# Patient Record
Sex: Male | Born: 1990
Health system: Southern US, Community
[De-identification: ages and names within clinical notes are randomized; demographics above are authoritative.]

## PROBLEM LIST (undated history)

## (undated) HISTORY — PX: NASAL SINUS SURGERY: SHX719

---

## 2010-11-12 ENCOUNTER — Emergency Department (HOSPITAL_COMMUNITY): Payer: Self-pay

## 2010-11-12 ENCOUNTER — Observation Stay (HOSPITAL_COMMUNITY)
Admission: EM | Admit: 2010-11-12 | Discharge: 2010-11-12 | Disposition: A | Discharge status: Home / Self Care | Payer: No Typology Code available for payment source | Attending: Oral and Maxillofacial Surgery | Admitting: Oral and Maxillofacial Surgery

## 2010-11-12 DIAGNOSIS — S0180XA Unspecified open wound of other part of head, initial encounter: Principal | ICD-10-CM | POA: Insufficient documentation

## 2010-11-12 DIAGNOSIS — S02109B Fracture of base of skull, unspecified side, initial encounter for open fracture: Secondary | ICD-10-CM | POA: Insufficient documentation

## 2010-11-12 DIAGNOSIS — Y998 Other external cause status: Secondary | ICD-10-CM | POA: Insufficient documentation

## 2010-11-12 DIAGNOSIS — Z01812 Encounter for preprocedural laboratory examination: Secondary | ICD-10-CM | POA: Insufficient documentation

## 2010-11-12 DIAGNOSIS — Z01818 Encounter for other preprocedural examination: Secondary | ICD-10-CM | POA: Insufficient documentation

## 2010-11-12 LAB — CBC
HCT: 38.1 % — ABNORMAL LOW (ref 39.0–52.0)
Hemoglobin: 14.1 g/dL (ref 13.0–17.0)
MCV: 84.1 fL (ref 78.0–100.0)
RBC: 4.53 MIL/uL (ref 4.22–5.81)
WBC: 8.5 10*3/uL (ref 4.0–10.5)

## 2010-11-12 LAB — BASIC METABOLIC PANEL
BUN: 13 mg/dL (ref 6–23)
CO2: 27 mEq/L (ref 19–32)
Chloride: 107 mEq/L (ref 96–112)
GFR calc non Af Amer: >90 mL/min (ref >90)
Glucose, Bld: 114 mg/dL — ABNORMAL HIGH (ref 70–99)
Potassium: 4.1 mEq/L (ref 3.5–5.1)
Sodium: 144 mEq/L (ref 135–145)
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
## 2018-02-06 HISTORY — PX: HIP SURGERY: SHX245

## 2020-09-17 DIAGNOSIS — L729 Follicular cyst of the skin and subcutaneous tissue, unspecified: Secondary | ICD-10-CM | POA: Diagnosis not present

## 2020-09-28 ENCOUNTER — Encounter (INDEPENDENT_AMBULATORY_CARE_PROVIDER_SITE_OTHER): Payer: Self-pay

## 2020-09-28 ENCOUNTER — Ambulatory Visit (INDEPENDENT_AMBULATORY_CARE_PROVIDER_SITE_OTHER): Payer: 59 | Admitting: General Surgery

## 2020-09-28 ENCOUNTER — Other Ambulatory Visit: Payer: Self-pay

## 2020-09-28 ENCOUNTER — Encounter: Payer: Self-pay | Admitting: General Surgery

## 2020-09-28 VITALS — BP 113/75 | HR 71 | Temp 98.6°F | Resp 12 | Ht 72.0 in | Wt 224.0 lb

## 2020-09-28 DIAGNOSIS — R599 Enlarged lymph nodes, unspecified: Secondary | ICD-10-CM

## 2020-09-29 NOTE — H&P (Signed)
Samuel Vaughn; [id number]; 02/12/1990   HPI Patient is a 30 year old white male who was referred by an urgent care center for evaluation treatment of a knot in the left axilla.  Is been present for many years.  There has been intermittent tenderness and swelling.  He denies any overall fever, chills.  The lump currently is more swollen and tender to touch.  He does have some pain that radiates down the left arm.  He denies any trauma or infection to the region. History reviewed. No pertinent past medical history.  History reviewed. No pertinent surgical history.  History reviewed. No pertinent family history.  No current outpatient medications on file prior to visit.   No current facility-administered medications on file prior to visit.    Allergies  Allergen Reactions   Cefaclor Other (See Comments)    unkown    Social History   Substance and Sexual Activity  Alcohol Use Yes   Comment: Weekly    Social History   Tobacco Use  Smoking Status Never  Smokeless Tobacco Never    Review of Systems  Constitutional: Negative.   HENT: Negative.    Eyes: Negative.   Respiratory: Negative.    Cardiovascular: Negative.   Gastrointestinal:  Positive for heartburn.  Genitourinary: Negative.   Musculoskeletal: Negative.   Skin: Negative.   Neurological: Negative.   Endo/Heme/Allergies: Negative.   Psychiatric/Behavioral: Negative.     Objective   Vitals:   09/28/20 1438  BP: 113/75  Pulse: 71  Resp: 12  Temp: 98.6 F (37 C)  SpO2: 97%    Physical Exam Vitals reviewed.  Constitutional:      Appearance: Normal appearance. He is not ill-appearing.  HENT:     Head: Normocephalic and atraumatic.  Cardiovascular:     Rate and Rhythm: Normal rate and regular rhythm.     Heart sounds: Normal heart sounds. No murmur heard.   No gallop.  Pulmonary:     Effort: Pulmonary effort is normal. No respiratory distress.     Breath sounds: Normal breath sounds. No stridor. No  wheezing, rhonchi or rales.  Lymphadenopathy:     Cervical: No cervical adenopathy.  Skin:    General: Skin is warm and dry.  Neurological:     Mental Status: He is alert and oriented to person, place, and time.  In the left axilla, a palpable 1-1/2 cm round mobile lymph node is palpable along the inferior aspect of the mid axillary line.  No other lymphadenopathy is noted.  No skin changes are noted.  Right axilla is negative for palpable nodes. Urgent care notes reviewed Assessment  Enlargement of left axillary lymph node Plan  Patient is scheduled for a left axillary lymph node biopsy on 10/06/2020.  The risks and benefits of the procedure were fully explained to the patient, who gave informed consent.  I did offer her ultrasound-guided needle aspiration, but the patient would like to proceed with a formal biopsy.

## 2020-09-29 NOTE — Patient Instructions (Signed)
Samuel PummelJoshua Vaughn  09/29/2020     @PREFPERIOPPHARMACY @   Your procedure is scheduled on  10/06/2020.   [facility]  0815 A.M.   Call this number if you have problems the morning of surgery:  [phone]   Remember:  Do not eat or drink after midnight.      Take these medicines the morning of surgery with A SIP OF WATER                                       None     Do not wear jewelry, make-up or nail polish.  Do not wear lotions, powders, or perfumes, or deodorant.  Do not shave 48 hours prior to surgery.  Men may shave face and neck.  Do not bring valuables to the hospital.  [facility] is not responsible for any belongings or valuables.  Contacts, dentures or bridgework may not be worn into surgery.  Leave your suitcase in the car.  After surgery it may be brought to your room.  For patients admitted to the hospital, discharge time will be determined by your treatment team.  Patients discharged the day of surgery will not be allowed to drive home and must have someone with them for 24 hours.    Special instructions:   DO NOT smoke tobacco or vape for 24 hours before your procedure.  Please read over the following fact sheets that you were given. Coughing and Deep Breathing, Surgical Site Infection Prevention, Anesthesia Post-op Instructions, and Care and Recovery After Surgery      Open Lymph Node Biopsy, Care After The following information offers guidance on how to care for yourself after your procedure. Your health care provider may also give you more specific instructions. If you have problems or questions, contact your health careprovider. What can I expect after the procedure? After the procedure, it is common to have: Bruising. Soreness. Mild swelling. Follow these instructions at home: Medicines Take over-the-counter and prescription medicines only as told by your health care provider. If you were prescribed an antibiotic  medicine, take or use it as told by your health care provider. Do not stop taking the antibiotic even if you start to feel better. Do not drive or use heavy machinery while taking prescription pain medicine. Incision care  Follow instructions from your health care provider about how to take care of your incision. Make sure you: Wash your hands with soap and water for at least 20 seconds before and after you change your bandage (dressing). If soap and water are not available, use hand sanitizer. Change your dressing as told by your health care provider. Leave stitches (sutures), skin glue, or adhesive strips in place. These skin closures may need to stay in place for 2 weeks or longer. If adhesive strip edges start to loosen and curl up, you may trim the loose edges. Do not remove adhesive strips completely unless your health care provider tells you to do that. Check your incision area every day for signs of infection. Check for: More redness, swelling, or pain. Fluid or blood. Warmth. Pus or a bad smell.  General instructions Do not take baths, swim, or use a hot tub until your health care provider approves. Ask your health care provider if you may take showers. You may only be allowed to take sponge baths.  If you were given a sedative during the procedure, it can affect you for several hours. Do not drive or operate machinery until your health care provider says that it is safe. Return to your normal activities as told by your health care provider. Ask your health care provider what activities are safe for you. Keep all follow-up visits. This is important. Contact a health care provider if: You have a fever. You have increased redness, swelling, or pain around your incision. You have fluid or blood coming from your incision. Your incision feels warm to the touch. You have pus or a bad smell coming from your incision. You have pain or numbness that gets worse or lasts longer than a few  days. Summary After a lymph node biopsy, it is common to have bruising, soreness, and mild swelling. Follow your health care provider's instructions about taking care of yourself at home. You will be told how to take medicines, take care of your incision, and check for infection. Return to your normal activities as told by your health care provider. Ask your health care provider what activities are safe for you. Contact a health care provider if you have increased redness, swelling, or pain around your incision, you have a fever, or you have worsening pain or numbness. This information is not intended to replace advice given to you by your health care provider. Make sure you discuss any questions you have with your healthcare provider. Document Revised: 11/06/2019 Document Reviewed: 11/06/2019 Elsevier Patient Education  2022 [employer] General Anesthesia, Adult, Care After This sheet gives you information about how to care for yourself after your procedure. Your health care provider may also give you more specific instructions. If you have problems or questions, contact your health careprovider. What can I expect after the procedure? After the procedure, the following side effects are common: Pain or discomfort at the IV site. Nausea. Vomiting. Sore throat. Trouble concentrating. Feeling cold or chills. Feeling weak or tired. Sleepiness and fatigue. Soreness and body aches. These side effects can affect parts of the body that were not involved in surgery. Follow these instructions at home: For the time period you were told by your health care provider:  Rest. Do not participate in activities where you could fall or become injured. Do not drive or use machinery. Do not drink alcohol. Do not take sleeping pills or medicines that cause drowsiness. Do not make important decisions or sign legal documents. Do not take care of children on your own.  Eating and drinking Follow any  instructions from your health care provider about eating or drinking restrictions. When you feel hungry, start by eating small amounts of foods that are soft and easy to digest (bland), such as toast. Gradually return to your regular diet. Drink enough fluid to keep your urine pale yellow. If you vomit, rehydrate by drinking water, juice, or clear broth. General instructions If you have sleep apnea, surgery and certain medicines can increase your risk for breathing problems. Follow instructions from your health care provider about wearing your sleep device: Anytime you are sleeping, including during daytime naps. While taking prescription pain medicines, sleeping medicines, or medicines that make you drowsy. Have a responsible adult stay with you for the time you are told. It is important to have someone help care for you until you are awake and alert. Return to your normal activities as told by your health care provider. Ask your health care provider what activities are safe for you. Take  over-the-counter and prescription medicines only as told by your health care provider. If you smoke, do not smoke without supervision. Keep all follow-up visits as told by your health care provider. This is important. Contact a health care provider if: You have nausea or vomiting that does not get better with medicine. You cannot eat or drink without vomiting. You have pain that does not get better with medicine. You are unable to pass urine. You develop a skin rash. You have a fever. You have redness around your IV site that gets worse. Get help right away if: You have difficulty breathing. You have chest pain. You have blood in your urine or stool, or you vomit blood. Summary After the procedure, it is common to have a sore throat or nausea. It is also common to feel tired. Have a responsible adult stay with you for the time you are told. It is important to have someone help care for you until you are  awake and alert. When you feel hungry, start by eating small amounts of foods that are soft and easy to digest (bland), such as toast. Gradually return to your regular diet. Drink enough fluid to keep your urine pale yellow. Return to your normal activities as told by your health care provider. Ask your health care provider what activities are safe for you. This information is not intended to replace advice given to you by your health care provider. Make sure you discuss any questions you have with your healthcare provider. Document Revised: 10/09/2019 Document Reviewed: 05/08/2019 Elsevier Patient Education  2022 Else[employer] Inc. How to Use Chlorhexidine for Bathing Chlorhexidine gluconate (CHG) is a germ-killing (antiseptic) solution that is used to clean the skin. It can get rid of the bacteria that normally live on the skin and can keep them away for about 24 hours. To clean your skin with CHG, you may be given: A CHG solution to use in the shower or as part of a sponge bath. A prepackaged cloth that contains CHG. Cleaning your skin with CHG may help lower the risk for infection: While you are staying in the intensive care unit of the hospital. If you have a vascular access, such as a central line, to provide short-term or long-term access to your veins. If you have a catheter to drain urine from your bladder. If you are on a ventilator. A ventilator is a machine that helps you breathe by moving air in and out of your lungs. After surgery. What are the risks? Risks of using CHG include: A skin reaction. Hearing loss, if CHG gets in your ears. Eye injury, if CHG gets in your eyes and is not rinsed out. The CHG product catching fire. Make sure that you avoid smoking and flames after applying CHG to your skin. Do not use CHG: If you have a chlorhexidine allergy or have previously reacted to chlorhexidine. On babies younger than 65 months of age. How to use CHG solution Use CHG only as told  by your health care provider, and follow the instructions on the label. Use the full amount of CHG as directed. Usually, this is one bottle. During a shower Follow these steps when using CHG solution during a shower (unless your health care provider gives you different instructions): Start the shower. Use your normal soap and shampoo to wash your face and hair. Turn off the shower or move out of the shower stream. Pour the CHG onto a clean washcloth. Do not use any type of brush  or rough-edged sponge. Starting at your neck, lather your body down to your toes. Make sure you follow these instructions: If you will be having surgery, pay special attention to the part of your body where you will be having surgery. Scrub this area for at least 1 minute. Do not use CHG on your head or face. If the solution gets into your ears or eyes, rinse them well with water. Avoid your genital area. Avoid any areas of skin that have broken skin, cuts, or scrapes. Scrub your back and under your arms. Make sure to wash skin folds. Let the lather sit on your skin for 1-2 minutes or as long as told by your health care provider. Thoroughly rinse your entire body in the shower. Make sure that all body creases and crevices are rinsed well. Dry off with a clean towel. Do not put any substances on your body afterward--such as powder, lotion, or perfume--unless you are told to do so by your health care provider. Only use lotions that are recommended by the manufacturer. Put on clean clothes or pajamas. If it is the night before your surgery, sleep in clean sheets.  During a sponge bath Follow these steps when using CHG solution during a sponge bath (unless your health care provider gives you different instructions): Use your normal soap and shampoo to wash your face and hair. Pour the CHG onto a clean washcloth. Starting at your neck, lather your body down to your toes. Make sure you follow these instructions: If you  will be having surgery, pay special attention to the part of your body where you will be having surgery. Scrub this area for at least 1 minute. Do not use CHG on your head or face. If the solution gets into your ears or eyes, rinse them well with water. Avoid your genital area. Avoid any areas of skin that have broken skin, cuts, or scrapes. Scrub your back and under your arms. Make sure to wash skin folds. Let the lather sit on your skin for 1-2 minutes or as long as told by your health care provider. Using a different clean, wet washcloth, thoroughly rinse your entire body. Make sure that all body creases and crevices are rinsed well. Dry off with a clean towel. Do not put any substances on your body afterward--such as powder, lotion, or perfume--unless you are told to do so by your health care provider. Only use lotions that are recommended by the manufacturer. Put on clean clothes or pajamas. If it is the night before your surgery, sleep in clean sheets. How to use CHG prepackaged cloths Only use CHG cloths as told by your health care provider, and follow the instructions on the label. Use the CHG cloth on clean, dry skin. Do not use the CHG cloth on your head or face unless your health care provider tells you to. When washing with the CHG cloth: Avoid your genital area. Avoid any areas of skin that have broken skin, cuts, or scrapes. Before surgery Follow these steps when using a CHG cloth to clean before surgery (unless your health care provider gives you different instructions): Using the CHG cloth, vigorously scrub the part of your body where you will be having surgery. Scrub using a back-and-forth motion for 3 minutes. The area on your body should be completely wet with CHG when you are done scrubbing. Do not rinse. Discard the cloth and let the area air-dry. Do not put any substances on the area afterward, such as  powder, lotion, or perfume. Put on clean clothes or pajamas. If it is  the night before your surgery, sleep in clean sheets.  For general bathing Follow these steps when using CHG cloths for general bathing (unless your health care provider gives you different instructions). Use a separate CHG cloth for each area of your body. Make sure you wash between any folds of skin and between your fingers and toes. Wash your body in the following order, switching to a new cloth after each step: The front of your neck, shoulders, and chest. Both of your arms, under your arms, and your hands. Your stomach and groin area, avoiding the genitals. Your right leg and foot. Your left leg and foot. The back of your neck, your back, and your buttocks. Do not rinse. Discard the cloth and let the area air-dry. Do not put any substances on your body afterward--such as powder, lotion, or perfume--unless you are told to do so by your health care provider. Only use lotions that are recommended by the manufacturer. Put on clean clothes or pajamas. Contact a health care provider if: Your skin gets irritated after scrubbing. You have questions about using your solution or cloth. Get help right away if: Your eyes become very red or swollen. Your eyes itch badly. Your skin itches badly and is red or swollen. Your hearing changes. You have trouble seeing. You have swelling or tingling in your mouth or throat. You have trouble breathing. You swallow any chlorhexidine. Summary Chlorhexidine gluconate (CHG) is a germ-killing (antiseptic) solution that is used to clean the skin. Cleaning your skin with CHG may help to lower your risk for infection. You may be given CHG to use for bathing. It may be in a bottle or in a prepackaged cloth to use on your skin. Carefully follow your health care provider's instructions and the instructions on the product label. Do not use CHG if you have a chlorhexidine allergy. Contact your health care provider if your skin gets irritated after scrubbing. This  information is not intended to replace advice given to you by your health care provider. Make sure you discuss any questions you have with your healthcare provider. Document Revised: 06/06/2019 Document Reviewed: 07/11/2019 Elsevier Patient Education  2022 [employer].

## 2020-09-29 NOTE — Progress Notes (Signed)
Samuel Vaughn; [id number]; 09/26/1990   HPI Patient is a 30-year-old white male who was referred by an urgent care center for evaluation treatment of a knot in the left axilla.  Is been present for many years.  There has been intermittent tenderness and swelling.  He denies any overall fever, chills.  The lump currently is more swollen and tender to touch.  He does have some pain that radiates down the left arm.  He denies any trauma or infection to the region. History reviewed. No pertinent past medical history.  History reviewed. No pertinent surgical history.  History reviewed. No pertinent family history.  No current outpatient medications on file prior to visit.   No current facility-administered medications on file prior to visit.    Allergies  Allergen Reactions   Cefaclor Other (See Comments)    unkown    Social History   Substance and Sexual Activity  Alcohol Use Yes   Comment: Weekly    Social History   Tobacco Use  Smoking Status Never  Smokeless Tobacco Never    Review of Systems  Constitutional: Negative.   HENT: Negative.    Eyes: Negative.   Respiratory: Negative.    Cardiovascular: Negative.   Gastrointestinal:  Positive for heartburn.  Genitourinary: Negative.   Musculoskeletal: Negative.   Skin: Negative.   Neurological: Negative.   Endo/Heme/Allergies: Negative.   Psychiatric/Behavioral: Negative.     Objective   Vitals:   09/28/20 1438  BP: 113/75  Pulse: 71  Resp: 12  Temp: 98.6 F (37 C)  SpO2: 97%    Physical Exam Vitals reviewed.  Constitutional:      Appearance: Normal appearance. He is not ill-appearing.  HENT:     Head: Normocephalic and atraumatic.  Cardiovascular:     Rate and Rhythm: Normal rate and regular rhythm.     Heart sounds: Normal heart sounds. No murmur heard.   No gallop.  Pulmonary:     Effort: Pulmonary effort is normal. No respiratory distress.     Breath sounds: Normal breath sounds. No stridor. No  wheezing, rhonchi or rales.  Lymphadenopathy:     Cervical: No cervical adenopathy.  Skin:    General: Skin is warm and dry.  Neurological:     Mental Status: He is alert and oriented to person, place, and time.  In the left axilla, a palpable 1-1/2 cm round mobile lymph node is palpable along the inferior aspect of the mid axillary line.  No other lymphadenopathy is noted.  No skin changes are noted.  Right axilla is negative for palpable nodes. Urgent care notes reviewed Assessment  Enlargement of left axillary lymph node Plan  Patient is scheduled for a left axillary lymph node biopsy on 10/06/2020.  The risks and benefits of the procedure were fully explained to the patient, who gave informed consent.  I did offer her ultrasound-guided needle aspiration, but the patient would like to proceed with a formal biopsy. 

## 2020-10-04 ENCOUNTER — Other Ambulatory Visit: Payer: Self-pay

## 2020-10-04 ENCOUNTER — Encounter (HOSPITAL_COMMUNITY): Payer: Self-pay

## 2020-10-04 ENCOUNTER — Encounter (HOSPITAL_COMMUNITY)
Admission: RE | Admit: 2020-10-04 | Discharge: 2020-10-04 | Disposition: A | Discharge status: Home / Self Care | Payer: 59 | Source: Ambulatory Visit | Attending: General Surgery | Admitting: General Surgery

## 2020-10-04 DIAGNOSIS — Z01812 Encounter for preprocedural laboratory examination: Secondary | ICD-10-CM | POA: Diagnosis not present

## 2020-10-04 LAB — CBC WITH DIFFERENTIAL/PLATELET
Abs Immature Granulocytes: 0.03 10*3/uL (ref 0.00–0.07)
Basophils Absolute: 0.1 10*3/uL (ref 0.0–0.1)
Basophils Relative: 1 %
Eosinophils Absolute: 0.1 10*3/uL (ref 0.0–0.5)
Eosinophils Relative: 2 %
HCT: 44.4 % (ref 39.0–52.0)
Hemoglobin: 15.6 g/dL (ref 13.0–17.0)
Immature Granulocytes: 0 %
Lymphocytes Relative: 30 %
Lymphs Abs: 2.2 10*3/uL (ref 0.7–4.0)
MCH: 29.8 pg (ref 26.0–34.0)
MCHC: 35.1 g/dL (ref 30.0–36.0)
MCV: 84.7 fL (ref 80.0–100.0)
Monocytes Absolute: 0.8 10*3/uL (ref 0.1–1.0)
Monocytes Relative: 11 %
Neutro Abs: 4.2 10*3/uL (ref 1.7–7.7)
Neutrophils Relative %: 56 %
Platelets: 260 10*3/uL (ref 150–400)
RBC: 5.24 MIL/uL (ref 4.22–5.81)
RDW: 12.2 % (ref 11.5–15.5)
WBC: 7.4 10*3/uL (ref 4.0–10.5)
nRBC: 0 % (ref 0.0–0.2)

## 2020-10-06 ENCOUNTER — Encounter (HOSPITAL_COMMUNITY): Payer: Self-pay | Admitting: General Surgery

## 2020-10-06 ENCOUNTER — Encounter (HOSPITAL_COMMUNITY)
Admission: RE | Disposition: A | Discharge status: Home / Self Care | Payer: Self-pay | Source: Home / Self Care | Attending: General Surgery

## 2020-10-06 ENCOUNTER — Ambulatory Visit (HOSPITAL_COMMUNITY)
Admission: RE | Admit: 2020-10-06 | Discharge: 2020-10-06 | Disposition: A | Discharge status: Home / Self Care | Payer: 59 | Attending: General Surgery | Admitting: General Surgery

## 2020-10-06 ENCOUNTER — Ambulatory Visit (HOSPITAL_COMMUNITY): Payer: 59 | Admitting: Certified Registered"

## 2020-10-06 DIAGNOSIS — L02412 Cutaneous abscess of left axilla: Secondary | ICD-10-CM | POA: Insufficient documentation

## 2020-10-06 DIAGNOSIS — R599 Enlarged lymph nodes, unspecified: Secondary | ICD-10-CM

## 2020-10-06 DIAGNOSIS — I889 Nonspecific lymphadenitis, unspecified: Secondary | ICD-10-CM | POA: Insufficient documentation

## 2020-10-06 DIAGNOSIS — R590 Localized enlarged lymph nodes: Secondary | ICD-10-CM | POA: Diagnosis not present

## 2020-10-06 DIAGNOSIS — I888 Other nonspecific lymphadenitis: Secondary | ICD-10-CM | POA: Diagnosis not present

## 2020-10-06 HISTORY — PX: AXILLARY LYMPH NODE BIOPSY: SHX5737

## 2020-10-06 SURGERY — AXILLARY LYMPH NODE BIOPSY
Anesthesia: General | Site: Axilla | Laterality: Left

## 2020-10-06 MED ORDER — ONDANSETRON HCL 4 MG/2ML IJ SOLN
INTRAMUSCULAR | Status: AC
  Filled 2020-10-06: qty 2

## 2020-10-06 MED ORDER — LIDOCAINE HCL (CARDIAC) PF 100 MG/5ML IV SOSY
PREFILLED_SYRINGE | INTRAVENOUS | Status: DC | PRN
  Administered 2020-10-06: 100 mg via INTRATRACHEAL

## 2020-10-06 MED ORDER — PROPOFOL 10 MG/ML IV BOLUS
INTRAVENOUS | Status: AC
  Filled 2020-10-06: qty 20

## 2020-10-06 MED ORDER — FENTANYL CITRATE PF 50 MCG/ML IJ SOSY: 25.0000 ug | PREFILLED_SYRINGE | INTRAMUSCULAR | Status: DC | PRN

## 2020-10-06 MED ORDER — 0.9 % SODIUM CHLORIDE (POUR BTL) OPTIME
TOPICAL | Status: DC | PRN
  Administered 2020-10-06: 1000 mL

## 2020-10-06 MED ORDER — ONDANSETRON HCL 4 MG/2ML IJ SOLN
INTRAMUSCULAR | Status: DC | PRN
  Administered 2020-10-06: 4 mg via INTRAVENOUS

## 2020-10-06 MED ORDER — FENTANYL CITRATE (PF) 100 MCG/2ML IJ SOLN
INTRAMUSCULAR | Status: DC | PRN
  Administered 2020-10-06 (×3): 50 ug via INTRAVENOUS

## 2020-10-06 MED ORDER — SULFAMETHOXAZOLE-TRIMETHOPRIM 800-160 MG PO TABS: 1.0000 | ORAL_TABLET | Freq: Two times a day (BID) | ORAL | 0 refills | Status: AC

## 2020-10-06 MED ORDER — LACTATED RINGERS IV SOLN: INTRAVENOUS | Status: DC

## 2020-10-06 MED ORDER — PROPOFOL 10 MG/ML IV BOLUS
INTRAVENOUS | Status: DC | PRN
  Administered 2020-10-06: 200 mg via INTRAVENOUS

## 2020-10-06 MED ORDER — FENTANYL CITRATE (PF) 100 MCG/2ML IJ SOLN
INTRAMUSCULAR | Status: AC
  Filled 2020-10-06: qty 2

## 2020-10-06 MED ORDER — KETOROLAC TROMETHAMINE 30 MG/ML IJ SOLN
30.0000 mg | Freq: Once | INTRAMUSCULAR | Status: AC
  Administered 2020-10-06: 30 mg via INTRAVENOUS
  Filled 2020-10-06: qty 1

## 2020-10-06 MED ORDER — MIDAZOLAM HCL 2 MG/2ML IJ SOLN
INTRAMUSCULAR | Status: AC
  Filled 2020-10-06: qty 2

## 2020-10-06 MED ORDER — HYDROCODONE-ACETAMINOPHEN 5-325 MG PO TABS: 1.0000 | ORAL_TABLET | ORAL | 0 refills | Status: AC | PRN

## 2020-10-06 MED ORDER — DEXMEDETOMIDINE (PRECEDEX) IN NS 20 MCG/5ML (4 MCG/ML) IV SYRINGE
PREFILLED_SYRINGE | INTRAVENOUS | Status: AC
  Filled 2020-10-06: qty 10

## 2020-10-06 MED ORDER — BUPIVACAINE LIPOSOME 1.3 % IJ SUSP
INTRAMUSCULAR | Status: DC | PRN
  Administered 2020-10-06: 15 mL

## 2020-10-06 MED ORDER — ORAL CARE MOUTH RINSE: 15.0000 mL | Freq: Once | OROMUCOSAL | Status: AC

## 2020-10-06 MED ORDER — ONDANSETRON HCL 4 MG/2ML IJ SOLN: 4.0000 mg | Freq: Once | INTRAMUSCULAR | Status: DC | PRN

## 2020-10-06 MED ORDER — DEXMEDETOMIDINE HCL IN NACL 200 MCG/50ML IV SOLN
INTRAVENOUS | Status: DC | PRN
  Administered 2020-10-06: 20 ug via INTRAVENOUS

## 2020-10-06 MED ORDER — DEXAMETHASONE SODIUM PHOSPHATE 10 MG/ML IJ SOLN
INTRAMUSCULAR | Status: AC
  Filled 2020-10-06: qty 1

## 2020-10-06 MED ORDER — BUPIVACAINE LIPOSOME 1.3 % IJ SUSP
INTRAMUSCULAR | Status: AC
  Filled 2020-10-06: qty 20

## 2020-10-06 MED ORDER — LIDOCAINE HCL (PF) 2 % IJ SOLN
INTRAMUSCULAR | Status: AC
  Filled 2020-10-06: qty 5

## 2020-10-06 MED ORDER — CHLORHEXIDINE GLUCONATE CLOTH 2 % EX PADS: 6.0000 | MEDICATED_PAD | Freq: Once | CUTANEOUS | Status: DC

## 2020-10-06 MED ORDER — DEXAMETHASONE SODIUM PHOSPHATE 4 MG/ML IJ SOLN
INTRAMUSCULAR | Status: DC | PRN
  Administered 2020-10-06: 5 mg via INTRAVENOUS

## 2020-10-06 MED ORDER — CHLORHEXIDINE GLUCONATE 0.12 % MT SOLN
15.0000 mL | Freq: Once | OROMUCOSAL | Status: AC
  Administered 2020-10-06: 15 mL via OROMUCOSAL

## 2020-10-06 MED ORDER — MIDAZOLAM HCL 5 MG/5ML IJ SOLN
INTRAMUSCULAR | Status: DC | PRN
  Administered 2020-10-06: 2 mg via INTRAVENOUS

## 2020-10-06 SURGICAL SUPPLY — 32 items
ADH SKN CLS APL DERMABOND .7 (GAUZE/BANDAGES/DRESSINGS) ×1
APL PRP STRL LF DISP 70% ISPRP (MISCELLANEOUS) ×1
BLADE SURG 15 STRL LF DISP TIS (BLADE) ×1 IMPLANT
BLADE SURG 15 STRL SS (BLADE) ×2
CHLORAPREP W/TINT 26 (MISCELLANEOUS) ×2 IMPLANT
CLOTH BEACON ORANGE TIMEOUT ST (SAFETY) ×2 IMPLANT
CNTNR URN SCR LID CUP LEK RST (MISCELLANEOUS) ×1 IMPLANT
CONT SPEC 4OZ STRL OR WHT (MISCELLANEOUS) ×2
COVER LIGHT HANDLE STERIS (MISCELLANEOUS) ×4 IMPLANT
DERMABOND ADVANCED (GAUZE/BANDAGES/DRESSINGS) ×1
DERMABOND ADVANCED .7 DNX12 (GAUZE/BANDAGES/DRESSINGS) ×1 IMPLANT
ELECT REM PT RETURN 9FT ADLT (ELECTROSURGICAL) ×2
ELECTRODE REM PT RTRN 9FT ADLT (ELECTROSURGICAL) ×1 IMPLANT
GLOVE SURG POLYISO LF SZ7.5 (GLOVE) ×4 IMPLANT
GLOVE SURG UNDER POLY LF SZ7 (GLOVE) ×6 IMPLANT
GOWN STRL REUS W/TWL LRG LVL3 (GOWN DISPOSABLE) ×6 IMPLANT
INST SET MINOR GENERAL (KITS) ×2 IMPLANT
KIT TURNOVER KIT A (KITS) ×2 IMPLANT
MANIFOLD NEPTUNE II (INSTRUMENTS) ×2 IMPLANT
NEEDLE HYPO 18GX1.5 BLUNT FILL (NEEDLE) ×2 IMPLANT
NEEDLE HYPO 21X1.5 SAFETY (NEEDLE) ×2 IMPLANT
PACK MINOR (CUSTOM PROCEDURE TRAY) ×2 IMPLANT
PAD ARMBOARD 7.5X6 YLW CONV (MISCELLANEOUS) ×2 IMPLANT
SET BASIN LINEN APH (SET/KITS/TRAYS/PACK) ×2 IMPLANT
SHEARS HARMONIC 9CM CVD (BLADE) ×2 IMPLANT
SPONGE INTESTINAL PEANUT (DISPOSABLE) ×2 IMPLANT
SPONGE T-LAP 18X18 ~~LOC~~+RFID (SPONGE) ×2 IMPLANT
SUT MNCRL AB 4-0 PS2 18 (SUTURE) ×2 IMPLANT
SUT VIC AB 3-0 SH 27 (SUTURE) ×2
SUT VIC AB 3-0 SH 27X BRD (SUTURE) ×1 IMPLANT
SWAB CULTURE ESWAB REG 1ML (MISCELLANEOUS) ×2 IMPLANT
SYR 20ML LL LF (SYRINGE) ×4 IMPLANT

## 2020-10-06 NOTE — Anesthesia Preprocedure Evaluation (Signed)
Anesthesia Evaluation  Patient identified by MRN, date of birth, ID band Patient awake    Reviewed: Allergy & Precautions, H&P , NPO status , Patient's Chart, lab work & pertinent test results, reviewed documented beta blocker date and time   Airway Mallampati: II  TM Distance: >3 FB Neck ROM: full    Dental no notable dental hx.    Pulmonary neg pulmonary ROS,    Pulmonary exam normal breath sounds clear to auscultation       Cardiovascular Exercise Tolerance: Good negative cardio ROS   Rhythm:regular Rate:Normal     Neuro/Psych negative neurological ROS  negative psych ROS   GI/Hepatic negative GI ROS, Neg liver ROS,   Endo/Other  negative endocrine ROS  Renal/GU negative Renal ROS  negative genitourinary   Musculoskeletal   Abdominal   Peds  Hematology negative hematology ROS (+)   Anesthesia Other Findings   Reproductive/Obstetrics negative OB ROS                             Anesthesia Physical Anesthesia Plan  ASA: 1  Anesthesia Plan: General   Post-op Pain Management:    Induction:   PONV Risk Score and Plan: Propofol infusion  Airway Management Planned:   Additional Equipment:   Intra-op Plan:   Post-operative Plan:   Informed Consent: I have reviewed the patients History and Physical, chart, labs and discussed the procedure including the risks, benefits and alternatives for the proposed anesthesia with the patient or authorized representative who has indicated his/her understanding and acceptance.     Dental Advisory Given  Plan Discussed with: CRNA  Anesthesia Plan Comments:         Anesthesia Quick Evaluation  

## 2020-10-06 NOTE — Transfer of Care (Signed)
Immediate Anesthesia Transfer of Care Note  Patient: Samuel Vaughn  Procedure(s) Performed: AXILLARY LYMPH NODE BIOPSY (Left: Axilla)  Patient Location: PACU  Anesthesia Type:General  Level of Consciousness: drowsy, patient cooperative and responds to stimulation  Airway & Oxygen Therapy: Patient Spontanous Breathing and Patient connected to face mask oxygen  Post-op Assessment: Report given to RN, Post -op Vital signs reviewed and stable and Patient moving all extremities X 4  Post vital signs: Reviewed and stable  Last Vitals:  Vitals Value Taken Time  BP    Temp    Pulse 60 10/06/20 1044  Resp 13 10/06/20 1044  SpO2 92 % 10/06/20 1044  Vitals shown include unvalidated device data.  Last Pain:  Vitals:   10/06/20 0833  TempSrc: Oral  PainSc: 0-No pain      Patients Stated Pain Goal: 8 (10/06/20 5462)  Complications: No notable events documented.

## 2020-10-06 NOTE — Op Note (Signed)
Patient:  Samuel Vaughn  DOB:  1990-10-18  MRN:  [id number]   Preop Diagnosis: Left axillary lymph node enlargement  Postop Diagnosis: Same, abscess of left axillary lymph node  Procedure: Left axillary lymph node biopsy  Surgeon: Franky Macho, MD  Anes: General  Indications: Patient is a 31 year old white male who presents with an enlarged tender lymph node in the left axilla.  The risks and benefits of the procedure including bleeding, infection, and the possibility of nerve injury were fully explained to the patient, who gave informed consent.  Procedure note: The patient was placed in the supine position.  After general anesthesia was administered, the left axilla was prepped and draped using the usual sterile technique with ChloraPrep.  Surgical site confirmation was performed.  Incision was made in the left axilla.  The dissection was taken down to the area of the easily palpable lymph node.  While dissecting the lymph node, an abscess cavity was found.  Anaerobic and aerobic cultures were taken and sent to microbiology.  This was a necrotic lymph node.  It was fully excised using the harmonic scalpel without difficulty.  It was sent to pathology for further examination.  Exparel was instilled into the surrounding wound.  The subcutaneous layer was reapproximated using a 3-0 Vicryl interrupted suture.  The skin was closed using a 4-0 Monocryl subcuticular suture.  Dermabond was applied.  All tape and needle counts were correct at the end of the procedure.  The patient was awakened and transferred to PACU in stable condition.  Complications: None  EBL: Minimal  Specimen: Left axillary lymph node

## 2020-10-06 NOTE — Anesthesia Procedure Notes (Signed)
Procedure Name: LMA Insertion Date/Time: 10/06/2020 9:52 AM Performed by: Brynda Peon, CRNA Pre-anesthesia Checklist: Patient identified, Emergency Drugs available, Suction available, Patient being monitored and Timeout performed Patient Re-evaluated:Patient Re-evaluated prior to induction Oxygen Delivery Method: Circle system utilized Preoxygenation: Pre-oxygenation with 100% oxygen Induction Type: IV induction LMA: LMA inserted LMA Size: 5.0 Number of attempts: 1 Placement Confirmation: positive ETCO2, CO2 detector and breath sounds checked- equal and bilateral Tube secured with: Tape Dental Injury: Teeth and Oropharynx as per pre-operative assessment

## 2020-10-06 NOTE — Interval H&P Note (Signed)
History and Physical Interval Note:  10/06/2020 9:24 AM  Samuel Vaughn  has presented today for surgery, with the diagnosis of Lymphadenopathy.  The various methods of treatment have been discussed with the patient and family. After consideration of risks, benefits and other options for treatment, the patient has consented to  Procedure(s): AXILLARY LYMPH NODE BIOPSY (Left) as a surgical intervention.  The patient's history has been reviewed, patient examined, no change in status, stable for surgery.  I have reviewed the patient's chart and labs.  Questions were answered to the patient's satisfaction.     Franky Macho

## 2020-10-06 NOTE — Anesthesia Postprocedure Evaluation (Signed)
Anesthesia Post Note  Patient: Samuel Vaughn  Procedure(s) Performed: AXILLARY LYMPH NODE BIOPSY (Left: Axilla)  Patient location during evaluation: Phase II Anesthesia Type: General Level of consciousness: awake Pain management: pain level controlled Vital Signs Assessment: post-procedure vital signs reviewed and stable Respiratory status: spontaneous breathing and respiratory function stable Cardiovascular status: blood pressure returned to baseline and stable Postop Assessment: no headache and no apparent nausea or vomiting Anesthetic complications: no Comments: Late entry   No notable events documented.   Last Vitals:  Vitals:   10/06/20 1115 10/06/20 1141  BP: 95/68 121/67  Pulse: 91 75  Resp: (!) 21 20  Temp:  (!) 36.3 C  SpO2: 93% (!) 64%    Last Pain:  Vitals:   10/06/20 1141  TempSrc:   PainSc: 0-No pain                 Windell Norfolk

## 2020-10-07 ENCOUNTER — Encounter (HOSPITAL_COMMUNITY): Payer: Self-pay | Admitting: General Surgery

## 2020-10-10 LAB — SURGICAL PATHOLOGY

## 2020-10-11 LAB — AEROBIC/ANAEROBIC CULTURE W GRAM STAIN (SURGICAL/DEEP WOUND): Culture: NO GROWTH

## 2020-10-17 ENCOUNTER — Telehealth (INDEPENDENT_AMBULATORY_CARE_PROVIDER_SITE_OTHER): Payer: 59 | Admitting: General Surgery

## 2020-10-17 DIAGNOSIS — Z09 Encounter for follow-up examination after completed treatment for conditions other than malignant neoplasm: Secondary | ICD-10-CM

## 2020-10-17 DIAGNOSIS — I82612 Acute embolism and thrombosis of superficial veins of left upper extremity: Secondary | ICD-10-CM

## 2020-10-17 NOTE — Telephone Encounter (Signed)
Postoperative telephone visit performed with patient's wife.  She states he is healing well from the biopsy site.  I told her that the cultures were negative for bacteria.  She states he still is having a pulling sensation with a possible tender vein in the left arm that has been present prior to surgery.  They do not have a primary care physician.  I told them that I would order an ultrasound of the left arm to further evaluate.  As this was a part of the global surgical fee, this was not a billable visit.  Total telephone time was 3 minutes.

## 2020-11-30 ENCOUNTER — Ambulatory Visit: Payer: Self-pay | Admitting: Internal Medicine
